# Patient Record
Sex: Female | Born: 1945 | Race: Black or African American | Hispanic: No | Marital: Married | Smoking: Former smoker
Health system: Southern US, Community
[De-identification: ages and names within clinical notes are randomized; demographics above are authoritative.]

## PROBLEM LIST (undated history)

## (undated) DIAGNOSIS — K649 Unspecified hemorrhoids: Secondary | ICD-10-CM

## (undated) DIAGNOSIS — T7840XA Allergy, unspecified, initial encounter: Secondary | ICD-10-CM

## (undated) DIAGNOSIS — Z860100 Personal history of colon polyps, unspecified: Secondary | ICD-10-CM

## (undated) DIAGNOSIS — M6530 Trigger finger, unspecified finger: Secondary | ICD-10-CM

## (undated) DIAGNOSIS — E785 Hyperlipidemia, unspecified: Secondary | ICD-10-CM

## (undated) DIAGNOSIS — H269 Unspecified cataract: Secondary | ICD-10-CM

## (undated) DIAGNOSIS — H409 Unspecified glaucoma: Secondary | ICD-10-CM

## (undated) DIAGNOSIS — Z8619 Personal history of other infectious and parasitic diseases: Secondary | ICD-10-CM

## (undated) DIAGNOSIS — I10 Essential (primary) hypertension: Secondary | ICD-10-CM

## (undated) HISTORY — DX: Unspecified cataract: H26.9

## (undated) HISTORY — PX: TONSILLECTOMY: SUR1361

## (undated) HISTORY — DX: Hyperlipidemia, unspecified: E78.5

## (undated) HISTORY — DX: Unspecified glaucoma: H40.9

## (undated) HISTORY — PX: CYST REMOVAL TRUNK: SHX6283

## (undated) HISTORY — DX: Essential (primary) hypertension: I10

## (undated) HISTORY — PX: COLONOSCOPY: SHX174

## (undated) HISTORY — PX: APPENDECTOMY: SHX54

---

## 2003-11-08 ENCOUNTER — Ambulatory Visit (HOSPITAL_COMMUNITY): Admission: RE | Admit: 2003-11-08 | Discharge: 2003-11-08 | Payer: Self-pay | Admitting: Family Medicine

## 2004-03-15 ENCOUNTER — Other Ambulatory Visit: Admission: RE | Admit: 2004-03-15 | Discharge: 2004-03-15 | Payer: Self-pay | Admitting: Oncology

## 2008-06-28 ENCOUNTER — Ambulatory Visit: Payer: Self-pay | Admitting: Unknown Physician Specialty

## 2008-12-19 ENCOUNTER — Encounter: Admission: RE | Admit: 2008-12-19 | Discharge: 2008-12-19 | Payer: Self-pay | Admitting: Family Medicine

## 2010-03-14 ENCOUNTER — Encounter: Admission: RE | Admit: 2010-03-14 | Discharge: 2010-03-14 | Payer: Self-pay | Admitting: Family Medicine

## 2010-03-20 ENCOUNTER — Ambulatory Visit: Payer: Self-pay | Admitting: Unknown Physician Specialty

## 2010-03-22 LAB — PATHOLOGY REPORT

## 2015-05-25 ENCOUNTER — Other Ambulatory Visit: Payer: Self-pay | Admitting: Family Medicine

## 2015-05-25 DIAGNOSIS — Z1231 Encounter for screening mammogram for malignant neoplasm of breast: Secondary | ICD-10-CM

## 2015-06-11 ENCOUNTER — Inpatient Hospital Stay: Payer: PPO

## 2015-06-11 ENCOUNTER — Encounter: Payer: Self-pay | Admitting: Internal Medicine

## 2015-06-11 ENCOUNTER — Inpatient Hospital Stay: Payer: PPO | Attending: Internal Medicine | Admitting: Internal Medicine

## 2015-06-11 VITALS — BP 145/82 | HR 108 | Temp 97.6°F | Resp 18 | Ht 64.7 in | Wt 133.2 lb

## 2015-06-11 DIAGNOSIS — I10 Essential (primary) hypertension: Secondary | ICD-10-CM | POA: Insufficient documentation

## 2015-06-11 DIAGNOSIS — N390 Urinary tract infection, site not specified: Secondary | ICD-10-CM | POA: Insufficient documentation

## 2015-06-11 DIAGNOSIS — D126 Benign neoplasm of colon, unspecified: Secondary | ICD-10-CM | POA: Insufficient documentation

## 2015-06-11 DIAGNOSIS — D709 Neutropenia, unspecified: Secondary | ICD-10-CM | POA: Diagnosis present

## 2015-06-11 DIAGNOSIS — E785 Hyperlipidemia, unspecified: Secondary | ICD-10-CM | POA: Insufficient documentation

## 2015-06-11 DIAGNOSIS — Z87440 Personal history of urinary (tract) infections: Secondary | ICD-10-CM | POA: Diagnosis not present

## 2015-06-11 DIAGNOSIS — T7840XA Allergy, unspecified, initial encounter: Secondary | ICD-10-CM | POA: Insufficient documentation

## 2015-06-11 DIAGNOSIS — M6530 Trigger finger, unspecified finger: Secondary | ICD-10-CM | POA: Insufficient documentation

## 2015-06-11 DIAGNOSIS — K649 Unspecified hemorrhoids: Secondary | ICD-10-CM | POA: Insufficient documentation

## 2015-06-11 LAB — CBC WITH DIFFERENTIAL/PLATELET
BASOS ABS: 0.2 10*3/uL — AB (ref 0–0.1)
BASOS PCT: 7 %
EOS ABS: 0 10*3/uL (ref 0–0.7)
Eosinophils Relative: 1 %
HCT: 44.2 % (ref 35.0–47.0)
HEMOGLOBIN: 14.8 g/dL (ref 12.0–16.0)
LYMPHS ABS: 1 10*3/uL (ref 1.0–3.6)
Lymphocytes Relative: 33 %
MCH: 32.6 pg (ref 26.0–34.0)
MCHC: 33.5 g/dL (ref 32.0–36.0)
MCV: 97.3 fL (ref 80.0–100.0)
Monocytes Absolute: 0.2 10*3/uL (ref 0.2–0.9)
Monocytes Relative: 6 %
NEUTROS PCT: 53 %
Neutro Abs: 1.7 10*3/uL (ref 1.4–6.5)
Platelets: 224 10*3/uL (ref 150–440)
RBC: 4.54 MIL/uL (ref 3.80–5.20)
RDW: 13.6 % (ref 11.5–14.5)
WBC: 3.2 10*3/uL — AB (ref 3.6–11.0)

## 2015-06-11 LAB — URINALYSIS COMPLETE WITH MICROSCOPIC (ARMC ONLY)
Bilirubin Urine: NEGATIVE
GLUCOSE, UA: NEGATIVE mg/dL
Ketones, ur: NEGATIVE mg/dL
Nitrite: POSITIVE — AB
Protein, ur: NEGATIVE mg/dL
Specific Gravity, Urine: 1.014 (ref 1.005–1.030)
pH: 5 (ref 5.0–8.0)

## 2015-06-11 LAB — VITAMIN B12: VITAMIN B 12: 779 pg/mL (ref 180–914)

## 2015-06-11 LAB — SAVE SMEAR

## 2015-06-11 NOTE — Progress Notes (Signed)
[place] @ [facility] [phone]  [phone]     Marilyn Ward OB: [birth year]  MR#: [id number]  KDX#:[id number]  Patient Care Team: Maryland Pink, MD as PCP - General (Family Medicine)  CHIEF COMPLAINT:  Neutropenia   No flowsheet data found.  HISTORY OF PRESENT ILLNESS:   Marilyn Ward is referred to our clinic for evaluation of neutropenia. Patient does not have clear recollection as of the duration of those problem. However, it appears that she was evaluated approximately 10 years ago by Dr. Humphrey Rolls for similar reason. We have not been able to review all the medical records except for the final clinic note, and it appears that the work up was essentially negative, and the patient was discharged from hematology clinic. Since then Marilyn Ward has done very well clinically she specifically denies fevers, chills, infections, including sinus and ear infection, pneumonias, skin infection, UTI. She was found to have signs of UTI on a recent urinalysis, which was performed as part of a routine physical, and urine culture grew pansensitive Escherichia coli, however she refused ciprofloxacin. She is completely a symptomatic, denying dysuria, urgency, frequency, blood in the urine. She denies joint pain, edema or redness.  REVIEW OF SYSTEMS:   Review of Systems  All other systems reviewed and are negative.    PAST MEDICAL HISTORY: Past Medical History  Diagnosis Date  . Hypertension   . Hyperlipidemia   . Cataract   . Glaucoma     PAST SURGICAL HISTORY: Past Surgical History  Procedure Laterality Date  . Appendectomy    . Tonsillectomy    . Cyst removal trunk      left shoulder    FAMILY HISTORY History reviewed. No pertinent family history. No history of hematologic problems, low blood counts, leukemia. ADVANCED DIRECTIVES:  No flowsheet data found.  HEALTH MAINTENANCE: Social History  Substance Use Topics  . Smoking status: Never Smoker   .  Smokeless tobacco: None  . Alcohol Use: No     Allergies  Allergen Reactions  . Tetracycline Anxiety    Current Outpatient Prescriptions  Medication Sig Dispense Refill  . l-methylfolate-B6-B12 (METANX) 3-35-2 MG TABS tablet Take 1 tablet by mouth daily as needed.    . vitamin B-12 (CYANOCOBALAMIN) 1000 MCG tablet Take 1,000 mcg by mouth daily as needed.    . ciprofloxacin (CIPRO) 250 MG tablet TK 1 T PO BID FOR 7 DAYS  0   No current facility-administered medications for this visit.    OBJECTIVE:  Filed Vitals:   06/11/15 1142  BP: 145/82  Pulse: 108  Temp: 97.6 F (36.4 C)  Resp: 18     There is no height or weight on file to calculate BMI.    ECOG FS:0 - Asymptomatic  Physical Exam  Constitutional: She is oriented to person, place, and time and well-developed, well-nourished, and in no distress. No distress.  African-American female, younger than stated age.  HENT:  Head: Normocephalic and atraumatic.  Right Ear: External ear normal.  Left Ear: External ear normal.  Mouth/Throat: Oropharynx is clear and moist.  Eyes: Conjunctivae are normal. Pupils are equal, round, and reactive to light. Right eye exhibits no discharge. Left eye exhibits no discharge. No scleral icterus.  Neck: Normal range of motion. Neck supple. No JVD present. No tracheal deviation present. No thyromegaly present.  Cardiovascular: Normal rate, regular rhythm, normal heart sounds and intact distal pulses.  Exam reveals no gallop and no friction rub.   No murmur heard. Pulmonary/Chest:  Effort normal and breath sounds normal. No stridor. No respiratory distress. She has no wheezes. She has no rales. She exhibits no tenderness.  Abdominal: Soft. Bowel sounds are normal. She exhibits no distension and no mass. There is no tenderness. There is no rebound and no guarding.  Genitourinary:  Postponed  Musculoskeletal: Normal range of motion. She exhibits no edema or tenderness.  Lymphadenopathy:    She  has no cervical adenopathy.  Neurological: She is alert and oriented to person, place, and time. She has normal reflexes. No cranial nerve deficit. She exhibits normal muscle tone. Gait normal. Coordination normal. GCS score is 15.  Skin: Skin is warm. No rash noted. She is not diaphoretic. No erythema. No pallor.  Psychiatric: Mood, memory, affect and judgment normal.     LAB RESULTS:  Recent Results (from the past 2160 hour(s))  CBC with Differential/Platelet     Status: Abnormal   Collection Time: 06/11/15 12:38 PM  Result Value Ref Range   WBC 3.2 (L) 3.6 - 11.0 K/uL   RBC 4.54 3.80 - 5.20 MIL/uL   Hemoglobin 14.8 12.0 - 16.0 g/dL   HCT 44.2 35.0 - 47.0 %   MCV 97.3 80.0 - 100.0 fL   MCH 32.6 26.0 - 34.0 pg   MCHC 33.5 32.0 - 36.0 g/dL   RDW 13.6 11.5 - 14.5 %   Platelets 224 150 - 440 K/uL   Neutrophils Relative % 53 %   Neutro Abs 1.7 1.4 - 6.5 K/uL   Lymphocytes Relative 33 %   Lymphs Abs 1.0 1.0 - 3.6 K/uL   Monocytes Relative 6 %   Monocytes Absolute 0.2 0.2 - 0.9 K/uL   Eosinophils Relative 1 %   Eosinophils Absolute 0.0 0 - 0.7 K/uL   Basophils Relative 7 %   Basophils Absolute 0.2 (H) 0 - 0.1 K/uL         STUDIES: No results found.  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 Also, she has not had any evidence of persistent or recurrent symptomatic infections, although urinalysis suggests subclinical UTI at least on 2 occasions one year apart. Overall, the differential diagnosis of neutropenia is brought, including vitamin B12 and copper deficiency, hepatitis and HIV infection, common variable immune deficiency and, myelodysplastic syndrome. Since  we do not have any results of the workup from the previous evaluation 10 years ago, and significant time has passed since then, we will perform frequent workup, chicken vitamin B12 levels, zinc and copper levels, hepatitis and HIV testing, peripheral blood smear review. If no worrisome findings emerge, and hematologic parameters are stable, we will likely monitor her clinically without any additional invasive procedures. However, we will consider bone marrow biopsy if absolute neutrophil count continues to decline.  UTI- patient is asymptomatic, but taking into account the persistent nature of abnormalities seen on urinalysis, we will repeat urinalysis and urine culture today and will strongly advise to take antibiotics to prevent potential damage to the urinary tract and kidneys.  Patient is due for mammogram later this year; she is up-to-date with colonoscopy, requiring it every 5 years.  Return to the clinic in 2 weeks to discuss the results of the workup.  Patient expressed understanding  and was in agreement with this plan. She also understands that She can call clinic at any time with any questions, concerns, or complaints.    No matching staging information was found for the patient.  Roxana Hires, MD   06/11/2015 11:19 AM

## 2015-06-11 NOTE — Progress Notes (Signed)
Pt new eval for leukopenia, pt has no fevers, no night sweats, no enlarged lymph nodes.  Was sent here for lab abnormalities and no sx.

## 2015-06-12 LAB — HIV ANTIBODY (ROUTINE TESTING W REFLEX): HIV SCREEN 4TH GENERATION: NONREACTIVE

## 2015-06-13 LAB — ANA W/REFLEX: Anti Nuclear Antibody(ANA): NEGATIVE

## 2015-06-13 LAB — HEPATITIS C ANTIBODY

## 2015-06-13 LAB — ZINC: ZINC: 92 ug/dL (ref 56–134)

## 2015-06-13 LAB — HEPATITIS B SURFACE ANTIBODY, QUANTITATIVE

## 2015-06-13 LAB — URINE CULTURE: Culture: >=100000

## 2015-06-13 LAB — HEPATITIS B CORE ANTIBODY, TOTAL: Hep B Core Total Ab: NEGATIVE

## 2015-06-13 LAB — COPPER, SERUM: Copper: 97 ug/dL (ref 72–166)

## 2015-06-18 ENCOUNTER — Telehealth: Payer: Self-pay | Admitting: *Deleted

## 2015-06-18 ENCOUNTER — Ambulatory Visit
Admission: RE | Admit: 2015-06-18 | Discharge: 2015-06-18 | Disposition: A | Discharge status: Home / Self Care | Payer: PPO | Source: Ambulatory Visit | Attending: Family Medicine | Admitting: Family Medicine

## 2015-06-18 DIAGNOSIS — Z1231 Encounter for screening mammogram for malignant neoplasm of breast: Secondary | ICD-10-CM | POA: Diagnosis present

## 2015-06-18 NOTE — Telephone Encounter (Signed)
Called pt to let her know that ua and urine cult. Results and it did confirm E coli like at the PCP office.  Dr. Rudean Hitt states that pt should take the atb she was prescribed with PCP.Marland Kitchen She has cipro 250 mg bid.  Pt states she will start atb.

## 2015-06-25 ENCOUNTER — Encounter: Payer: Self-pay | Admitting: Internal Medicine

## 2015-06-25 ENCOUNTER — Inpatient Hospital Stay: Payer: PPO | Attending: Internal Medicine | Admitting: Internal Medicine

## 2015-06-25 VITALS — BP 160/95 | HR 85 | Temp 97.6°F | Resp 18 | Ht 64.7 in | Wt 132.9 lb

## 2015-06-25 DIAGNOSIS — H409 Unspecified glaucoma: Secondary | ICD-10-CM | POA: Insufficient documentation

## 2015-06-25 DIAGNOSIS — E785 Hyperlipidemia, unspecified: Secondary | ICD-10-CM | POA: Diagnosis not present

## 2015-06-25 DIAGNOSIS — I10 Essential (primary) hypertension: Secondary | ICD-10-CM | POA: Insufficient documentation

## 2015-06-25 DIAGNOSIS — D709 Neutropenia, unspecified: Secondary | ICD-10-CM | POA: Diagnosis not present

## 2015-06-25 DIAGNOSIS — Z1231 Encounter for screening mammogram for malignant neoplasm of breast: Secondary | ICD-10-CM | POA: Insufficient documentation

## 2015-06-25 DIAGNOSIS — N390 Urinary tract infection, site not specified: Secondary | ICD-10-CM | POA: Insufficient documentation

## 2015-06-25 DIAGNOSIS — Z79899 Other long term (current) drug therapy: Secondary | ICD-10-CM | POA: Diagnosis not present

## 2015-06-25 DIAGNOSIS — H269 Unspecified cataract: Secondary | ICD-10-CM | POA: Insufficient documentation

## 2015-06-25 NOTE — Progress Notes (Signed)
Pt here to get results of labs done for her neutropenia. No fevers noted.  Will finish up last pill of the cipro today for uti.

## 2015-06-25 NOTE — Progress Notes (Addendum)
[place] @ [facility] [phone]  [phone]     Marilyn Ward OB: 1945/09/17  MR#: [id number]  [id number]  Patient Care Team: Maryland Pink, MD as PCP - General (Family Medicine)  CHIEF COMPLAINT:  Neutropenia   No flowsheet data found.  HISTORY OF PRESENT ILLNESS:   Marilyn Ward is referred to our clinic for evaluation of neutropenia. Patient does not have clear recollection as of the duration of those problem. However, it appears that she was evaluated approximately 10 years ago by Dr. Humphrey Rolls for similar reason. We have not been able to review all the medical records except for the final clinic note, and it appears that the work up was essentially negative, and the patient was discharged from hematology clinic.   Current status: Marilyn Ward returns to the clinic to discuss the results of the workup. She continues to do well clinically. She has almost completed treatment course for UTI with only 1 dose of ciprofloxacin (take.  Since then Marilyn Ward has done very well clinically she specifically denies fevers, chills, infections, including sinus and ear infection, pneumonias, skin infection. She is completely asymptomatic, denying dysuria, urgency, frequency, blood in the urine. She denies joint pain, edema or redness.  REVIEW OF SYSTEMS:   Review of Systems  All other systems reviewed and are negative.    PAST MEDICAL HISTORY: Past Medical History  Diagnosis Date  . Hypertension   . Hyperlipidemia   . Cataract   . Glaucoma     PAST SURGICAL HISTORY: Past Surgical History  Procedure Laterality Date  . Appendectomy    . Tonsillectomy    . Cyst removal trunk      left shoulder    FAMILY HISTORY No family history on file. No history of hematologic problems, low blood counts, leukemia. ADVANCED DIRECTIVES:  No flowsheet data found.  HEALTH MAINTENANCE: Social History  Substance Use Topics  . Smoking status: Never Smoker   .  Smokeless tobacco: Not on file  . Alcohol Use: No     Allergies  Allergen Reactions  . Tetracycline Anxiety    Current Outpatient Prescriptions  Medication Sig Dispense Refill  . ciprofloxacin (CIPRO) 250 MG tablet TK 1 T PO BID FOR 7 DAYS  0  . l-methylfolate-B6-B12 (METANX) 3-35-2 MG TABS tablet Take 1 tablet by mouth daily as needed.    . vitamin B-12 (CYANOCOBALAMIN) 1000 MCG tablet Take 1,000 mcg by mouth daily as needed.     No current facility-administered medications for this visit.    OBJECTIVE:  Filed Vitals:   06/25/15 1022  BP: 160/95  Pulse: 85  Temp: 97.6 F (36.4 C)  Resp: 18     Body mass index is 22.34 kg/(m^2).    ECOG FS:0 - Asymptomatic  Physical Exam  Constitutional: She is oriented to person, place, and time and well-developed, well-nourished, and in no distress. No distress.  African-American female, younger than stated age.  HENT:  Head: Normocephalic and atraumatic.  Right Ear: External ear normal.  Left Ear: External ear normal.  Mouth/Throat: Oropharynx is clear and moist.  Eyes: Conjunctivae are normal. Pupils are equal, round, and reactive to light. Right eye exhibits no discharge. Left eye exhibits no discharge. No scleral icterus.  Neck: Normal range of motion. Neck supple. No JVD present. No tracheal deviation present. No thyromegaly present.  Cardiovascular: Normal rate, regular rhythm, normal heart sounds and intact distal pulses.  Exam reveals no gallop and no friction rub.   No murmur heard.  Pulmonary/Chest: Effort normal and breath sounds normal. No stridor. No respiratory distress. She has no wheezes. She has no rales. She exhibits no tenderness.  Abdominal: Soft. Bowel sounds are normal. She exhibits no distension and no mass. There is no tenderness. There is no rebound and no guarding.  Genitourinary:  Postponed  Musculoskeletal: Normal range of motion. She exhibits no edema or tenderness.  Lymphadenopathy:    She has no cervical  adenopathy.  Neurological: She is alert and oriented to person, place, and time. She has normal reflexes. No cranial nerve deficit. She exhibits normal muscle tone. Gait normal. Coordination normal. GCS score is 15.  Skin: Skin is warm. No rash noted. She is not diaphoretic. No erythema. No pallor.  Psychiatric: Mood, memory, affect and judgment normal.     LAB RESULTS:  Recent Results (from the past 2160 hour(s))  HIV antibody (with reflex)     Status: None   Collection Time: 06/11/15 12:38 PM  Result Value Ref Range   HIV Screen 4th Generation wRfx Non Reactive Non Reactive    Comment: (NOTE) Performed At: [facility] [place], [place] [id number] Lindon Romp MD [id number]   Save smear     Status: None   Collection Time: 06/11/15 12:38 PM  Result Value Ref Range   Smear Review SMEAR STAINED AND AVAILABLE FOR REVIEW   CBC with Differential/Platelet     Status: Abnormal   Collection Time: 06/11/15 12:38 PM  Result Value Ref Range   WBC 3.2 (L) 3.6 - 11.0 K/uL   RBC 4.54 3.80 - 5.20 MIL/uL   Hemoglobin 14.8 12.0 - 16.0 g/dL   HCT 44.2 35.0 - 47.0 %   MCV 97.3 80.0 - 100.0 fL   MCH 32.6 26.0 - 34.0 pg   MCHC 33.5 32.0 - 36.0 g/dL   RDW 13.6 11.5 - 14.5 %   Platelets 224 150 - 440 K/uL   Neutrophils Relative % 53 %   Neutro Abs 1.7 1.4 - 6.5 K/uL   Lymphocytes Relative 33 %   Lymphs Abs 1.0 1.0 - 3.6 K/uL   Monocytes Relative 6 %   Monocytes Absolute 0.2 0.2 - 0.9 K/uL   Eosinophils Relative 1 %   Eosinophils Absolute 0.0 0 - 0.7 K/uL   Basophils Relative 7 %   Basophils Absolute 0.2 (H) 0 - 0.1 K/uL  Vitamin B12     Status: None   Collection Time: 06/11/15 12:38 PM  Result Value Ref Range   Vitamin B-12 779 180 - 914 pg/mL    Comment: (NOTE) This assay is not validated for testing neonatal or myeloproliferative syndrome specimens for Vitamin B12 levels. Performed at [facility]   ANA w/Reflex     Status: None   Collection  Time: 06/11/15 12:38 PM  Result Value Ref Range   Anit Nuclear Antibody(ANA) Negative Negative    Comment: (NOTE) Performed At: [facility] [address] [place], [place] [id number] Lindon Romp MD [id number]   Copper, serum     Status: None   Collection Time: 06/11/15 12:38 PM  Result Value Ref Range   Copper 97 72 - 166 ug/dL    Comment: (NOTE)                                Detection Limit = 5 Performed At: [facility] [place], [place] [id number] Lindon Romp MD [id number]  Zinc     Status: None   Collection Time: 06/11/15 12:38 PM  Result Value Ref Range   Zinc 92 56 - 134 ug/dL    Comment: (NOTE)                                Detection Limit = 5 Performed At: [facility] [address] [place], [place] [id number] Lindon Romp MD [id number]   Hepatitis C antibody     Status: None   Collection Time: 06/11/15 12:38 PM  Result Value Ref Range   HCV Ab <0.1 0.0 - 0.9 s/co ratio    Comment: (NOTE)                                  Negative:     < 0.8                             Indeterminate: 0.8 - 0.9                                  Positive:     > 0.9 The CDC recommends that a positive HCV antibody result be followed up with a HCV Nucleic Acid Amplification test [id number]). Performed At: [facility] [place], [place] [id number] Lindon Romp MD [id number]   Hepatitis B core antibody, total     Status: None   Collection Time: 06/11/15 12:38 PM  Result Value Ref Range   Hep B Core Total Ab Negative Negative    Comment: (NOTE) Performed At: [facility] [place], [place] [id number] Lindon Romp MD [id number]   Hepatitis B surface antibody     Status: Abnormal   Collection Time: 06/11/15 12:38 PM  Result Value Ref Range   Hepatitis B-Post <3.1 (L) Immunity>9.9 mIU/mL    Comment: (NOTE)  Status of Immunity                     Anti-HBs Level   ------------------                     -------------- Inconsistent with Immunity                   0.0 - 9.9 Consistent with Immunity                          >9.9 Performed At: [facility] [place], [place] [id number] Lindon Romp MD [id number]   Urinalysis complete, with microscopic [facility])     Status: Abnormal   Collection Time: 06/11/15  1:20 PM  Result Value Ref Range   Color, Urine YELLOW (A) YELLOW   APPearance HAZY (A) CLEAR   Glucose, UA NEGATIVE NEGATIVE mg/dL   Bilirubin Urine NEGATIVE NEGATIVE   Ketones, ur NEGATIVE NEGATIVE mg/dL   Specific Gravity, Urine 1.014 1.005 - 1.030   Hgb urine dipstick 1+ (A) NEGATIVE   pH 5.0 5.0 - 8.0   Protein, ur NEGATIVE NEGATIVE mg/dL   Nitrite POSITIVE (A) NEGATIVE   Leukocytes, UA TRACE (A) NEGATIVE   RBC / HPF 0-5 0 - 5 RBC/hpf   WBC, UA 0-5 0 -  5 WBC/hpf   Bacteria, UA MANY (A) NONE SEEN   Squamous Epithelial / LPF 0-5 (A) NONE SEEN   Mucous PRESENT   Urine culture     Status: None   Collection Time: 06/11/15  1:20 PM  Result Value Ref Range   Specimen Description URINE, CLEAN CATCH    Special Requests urine    Culture >=100,000 COLONIES/mL ESCHERICHIA COLI    Report Status 06/13/2015 FINAL    Organism ID, Bacteria ESCHERICHIA COLI       Susceptibility   Escherichia coli - MIC*    AMPICILLIN Value in next row Resistant      RESISTANT>=32    CEFAZOLIN Value in next row Sensitive      SENSITIVE<=4    CEFTRIAXONE Value in next row Sensitive      SENSITIVE<=1    ERTAPENEM Value in next row Sensitive      SENSITIVE<=0.5    Extended ESBL Value in next row Sensitive      SENSITIVE<=0.5    IMIPENEM Value in next row Sensitive      SENSITIVE<=0.25    GENTAMICIN Value in next row Sensitive      SENSITIVE<=1    CIPROFLOXACIN Value in next row Sensitive      SENSITIVE<=0.25    LEVOFLOXACIN Value in next row Sensitive      SENSITIVE<=0.12    NITROFURANTOIN Value in next row Sensitive       SENSITIVE<=16    TRIMETH/SULFA Value in next row Sensitive      SENSITIVE<=20    PIP/TAZO Value in next row Sensitive      SENSITIVE<=4    * >=100,000 COLONIES/mL ESCHERICHIA COLI         STUDIES: Mm Digital Screening Bilateral  06/18/2015  CLINICAL DATA:  Screening. EXAM: DIGITAL SCREENING BILATERAL MAMMOGRAM WITH CAD COMPARISON:  Previous exam(s). ACR Breast Density Category c: The breast tissue is heterogeneously dense, which may obscure small masses. FINDINGS: There are no findings suspicious for malignancy. Images were processed with CAD. IMPRESSION: No mammographic evidence of malignancy. A result letter of this screening mammogram will be mailed directly to the patient. RECOMMENDATION: Screening mammogram in one year. (Code:SM-B-01Y) BI-RADS CATEGORY  1: Negative. Electronically Signed   By: Franki Cabot M.D.   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 Also, she has not had any evidence of persistent or recurrent symptomatic infections, although urinalysis suggests subclinical UTI at least on 2 occasions one year apart.  Our workup also did not show any evidence of vitamin or mineral deficiency, hepatitis or HIV infection. Indeed, her absolute neutrophil count is entirely within normal range at 1700 cells per microliter. 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 She does not require any additional follow-up with hematology or oncology.   UTI- hour urinalysis and urine cultures were consistent with urinary tract infection with Escherichia  coli, so she was initiated on treatment with ciprofloxacin, to which Escherichia coli is sensitive. She will complete the treatment today and we will recheck her urinalysis and urine culture next week at the primary care physician's office.  Patient had mammogram on 06/18/2015, and it was negative; she is up-to-date with colonoscopy, requiring it every 5 years.  Patient expressed understanding and was in agreement with this plan. She also understands that She can call clinic at any time with any questions, concerns, or complaints.    No matching staging information was found for the patient.  Roxana Hires, MD   06/25/2015 10:17 AM

## 2015-09-05 DIAGNOSIS — H2513 Age-related nuclear cataract, bilateral: Secondary | ICD-10-CM | POA: Diagnosis not present

## 2015-09-05 DIAGNOSIS — H401111 Primary open-angle glaucoma, right eye, mild stage: Secondary | ICD-10-CM | POA: Diagnosis not present

## 2015-09-05 DIAGNOSIS — H401123 Primary open-angle glaucoma, left eye, severe stage: Secondary | ICD-10-CM | POA: Diagnosis not present

## 2015-10-19 DIAGNOSIS — H401111 Primary open-angle glaucoma, right eye, mild stage: Secondary | ICD-10-CM | POA: Diagnosis not present

## 2015-11-02 DIAGNOSIS — H04123 Dry eye syndrome of bilateral lacrimal glands: Secondary | ICD-10-CM | POA: Diagnosis not present

## 2015-11-02 DIAGNOSIS — H401111 Primary open-angle glaucoma, right eye, mild stage: Secondary | ICD-10-CM | POA: Diagnosis not present

## 2015-11-02 DIAGNOSIS — H2513 Age-related nuclear cataract, bilateral: Secondary | ICD-10-CM | POA: Diagnosis not present

## 2015-11-02 DIAGNOSIS — H401131 Primary open-angle glaucoma, bilateral, mild stage: Secondary | ICD-10-CM | POA: Diagnosis not present

## 2015-12-01 DIAGNOSIS — R404 Transient alteration of awareness: Secondary | ICD-10-CM | POA: Diagnosis not present

## 2016-02-22 DIAGNOSIS — H6123 Impacted cerumen, bilateral: Secondary | ICD-10-CM | POA: Diagnosis not present

## 2016-05-14 DIAGNOSIS — Z0000 Encounter for general adult medical examination without abnormal findings: Secondary | ICD-10-CM | POA: Diagnosis not present

## 2016-05-15 DIAGNOSIS — N390 Urinary tract infection, site not specified: Secondary | ICD-10-CM | POA: Diagnosis not present

## 2016-05-21 DIAGNOSIS — Z0000 Encounter for general adult medical examination without abnormal findings: Secondary | ICD-10-CM | POA: Diagnosis not present

## 2016-05-21 DIAGNOSIS — I10 Essential (primary) hypertension: Secondary | ICD-10-CM | POA: Diagnosis not present

## 2016-05-21 DIAGNOSIS — E785 Hyperlipidemia, unspecified: Secondary | ICD-10-CM | POA: Diagnosis not present

## 2016-05-21 DIAGNOSIS — H6123 Impacted cerumen, bilateral: Secondary | ICD-10-CM | POA: Diagnosis not present

## 2016-06-05 DIAGNOSIS — H903 Sensorineural hearing loss, bilateral: Secondary | ICD-10-CM | POA: Diagnosis not present

## 2016-06-05 DIAGNOSIS — H6123 Impacted cerumen, bilateral: Secondary | ICD-10-CM | POA: Diagnosis not present

## 2016-06-19 DIAGNOSIS — H401111 Primary open-angle glaucoma, right eye, mild stage: Secondary | ICD-10-CM | POA: Diagnosis not present

## 2016-06-19 DIAGNOSIS — H401122 Primary open-angle glaucoma, left eye, moderate stage: Secondary | ICD-10-CM | POA: Diagnosis not present

## 2016-07-23 DIAGNOSIS — G5702 Lesion of sciatic nerve, left lower limb: Secondary | ICD-10-CM | POA: Diagnosis not present

## 2016-07-30 DIAGNOSIS — Z86010 Personal history of colonic polyps: Secondary | ICD-10-CM | POA: Diagnosis not present

## 2016-09-03 DIAGNOSIS — H401122 Primary open-angle glaucoma, left eye, moderate stage: Secondary | ICD-10-CM | POA: Diagnosis not present

## 2016-09-03 DIAGNOSIS — H2513 Age-related nuclear cataract, bilateral: Secondary | ICD-10-CM | POA: Diagnosis not present

## 2016-09-30 ENCOUNTER — Encounter: Payer: Self-pay | Admitting: *Deleted

## 2016-10-01 ENCOUNTER — Encounter: Admission: RE | Payer: Self-pay | Source: Ambulatory Visit

## 2016-10-01 ENCOUNTER — Ambulatory Visit: Admission: RE | Admit: 2016-10-01 | Payer: PPO | Source: Ambulatory Visit | Admitting: Unknown Physician Specialty

## 2016-10-01 HISTORY — DX: Personal history of colonic polyps: Z86.010

## 2016-10-01 HISTORY — DX: Personal history of colon polyps, unspecified: Z86.0100

## 2016-10-01 HISTORY — DX: Trigger finger, unspecified finger: M65.30

## 2016-10-01 HISTORY — DX: Allergy, unspecified, initial encounter: T78.40XA

## 2016-10-01 HISTORY — DX: Unspecified hemorrhoids: K64.9

## 2016-10-01 HISTORY — DX: Personal history of other infectious and parasitic diseases: Z86.19

## 2016-10-01 SURGERY — COLONOSCOPY WITH PROPOFOL
Anesthesia: General

## 2016-11-10 DIAGNOSIS — B029 Zoster without complications: Secondary | ICD-10-CM | POA: Diagnosis not present

## 2016-12-12 DIAGNOSIS — M792 Neuralgia and neuritis, unspecified: Secondary | ICD-10-CM | POA: Diagnosis not present

## 2016-12-12 DIAGNOSIS — B029 Zoster without complications: Secondary | ICD-10-CM | POA: Diagnosis not present

## 2016-12-19 ENCOUNTER — Encounter: Admission: RE | Payer: Self-pay | Source: Ambulatory Visit

## 2016-12-19 ENCOUNTER — Ambulatory Visit: Admission: RE | Admit: 2016-12-19 | Payer: PPO | Source: Ambulatory Visit | Admitting: Unknown Physician Specialty

## 2016-12-19 SURGERY — COLONOSCOPY WITH PROPOFOL
Anesthesia: General

## 2017-01-22 ENCOUNTER — Telehealth: Payer: Self-pay | Admitting: *Deleted

## 2017-01-22 NOTE — Telephone Encounter (Signed)
Call from relative stating that patient seen by Dr Rudean Hitt in 2016 for neutropenia and that she was told she could come back again if needed. Reports that she has had right hip pain since June 2017 and she is requesting to be evaluated here to be sure that she does not have "something going on with her bane marrow" I advised that she needs to contact her PCP and if he feels she needs to come back, then he can refer her here. According to her chart, she has shingles from her right abd and back to chest and flank area

## 2017-01-28 DIAGNOSIS — H2513 Age-related nuclear cataract, bilateral: Secondary | ICD-10-CM | POA: Diagnosis not present

## 2017-01-28 DIAGNOSIS — H401123 Primary open-angle glaucoma, left eye, severe stage: Secondary | ICD-10-CM | POA: Diagnosis not present

## 2017-01-28 DIAGNOSIS — H401111 Primary open-angle glaucoma, right eye, mild stage: Secondary | ICD-10-CM | POA: Diagnosis not present

## 2017-02-02 DIAGNOSIS — S99912A Unspecified injury of left ankle, initial encounter: Secondary | ICD-10-CM | POA: Diagnosis not present

## 2017-02-02 DIAGNOSIS — M25552 Pain in left hip: Secondary | ICD-10-CM | POA: Diagnosis not present

## 2017-02-02 DIAGNOSIS — S93402A Sprain of unspecified ligament of left ankle, initial encounter: Secondary | ICD-10-CM | POA: Diagnosis not present

## 2017-02-02 DIAGNOSIS — M79672 Pain in left foot: Secondary | ICD-10-CM | POA: Diagnosis not present

## 2017-02-02 DIAGNOSIS — R300 Dysuria: Secondary | ICD-10-CM | POA: Diagnosis not present

## 2017-03-18 DIAGNOSIS — S93402A Sprain of unspecified ligament of left ankle, initial encounter: Secondary | ICD-10-CM | POA: Diagnosis not present

## 2017-03-18 DIAGNOSIS — M25572 Pain in left ankle and joints of left foot: Secondary | ICD-10-CM | POA: Diagnosis not present

## 2017-03-30 DIAGNOSIS — M25572 Pain in left ankle and joints of left foot: Secondary | ICD-10-CM | POA: Diagnosis not present

## 2017-05-19 DIAGNOSIS — Z0000 Encounter for general adult medical examination without abnormal findings: Secondary | ICD-10-CM | POA: Diagnosis not present

## 2017-05-26 DIAGNOSIS — E785 Hyperlipidemia, unspecified: Secondary | ICD-10-CM | POA: Diagnosis not present

## 2017-05-26 DIAGNOSIS — Z0000 Encounter for general adult medical examination without abnormal findings: Secondary | ICD-10-CM | POA: Diagnosis not present

## 2017-05-28 ENCOUNTER — Other Ambulatory Visit: Payer: Self-pay | Admitting: Family Medicine

## 2017-05-28 DIAGNOSIS — Z1231 Encounter for screening mammogram for malignant neoplasm of breast: Secondary | ICD-10-CM

## 2017-06-03 DIAGNOSIS — H401123 Primary open-angle glaucoma, left eye, severe stage: Secondary | ICD-10-CM | POA: Diagnosis not present

## 2017-06-03 DIAGNOSIS — H401111 Primary open-angle glaucoma, right eye, mild stage: Secondary | ICD-10-CM | POA: Diagnosis not present

## 2017-07-28 DIAGNOSIS — Z86010 Personal history of colonic polyps: Secondary | ICD-10-CM | POA: Diagnosis not present

## 2017-09-30 DIAGNOSIS — H401111 Primary open-angle glaucoma, right eye, mild stage: Secondary | ICD-10-CM | POA: Diagnosis not present

## 2017-09-30 DIAGNOSIS — H2513 Age-related nuclear cataract, bilateral: Secondary | ICD-10-CM | POA: Diagnosis not present

## 2017-09-30 DIAGNOSIS — H401123 Primary open-angle glaucoma, left eye, severe stage: Secondary | ICD-10-CM | POA: Diagnosis not present

## 2017-10-09 ENCOUNTER — Ambulatory Visit: Admission: RE | Admit: 2017-10-09 | Payer: PPO | Source: Ambulatory Visit | Admitting: Unknown Physician Specialty

## 2017-10-09 ENCOUNTER — Encounter: Admission: RE | Payer: Self-pay | Source: Ambulatory Visit

## 2017-10-09 SURGERY — COLONOSCOPY WITH PROPOFOL
Anesthesia: General

## 2018-05-19 DIAGNOSIS — D1724 Benign lipomatous neoplasm of skin and subcutaneous tissue of left leg: Secondary | ICD-10-CM | POA: Diagnosis not present

## 2018-05-19 DIAGNOSIS — M25572 Pain in left ankle and joints of left foot: Secondary | ICD-10-CM | POA: Diagnosis not present

## 2018-06-10 DIAGNOSIS — H401123 Primary open-angle glaucoma, left eye, severe stage: Secondary | ICD-10-CM | POA: Diagnosis not present

## 2018-06-10 DIAGNOSIS — H2513 Age-related nuclear cataract, bilateral: Secondary | ICD-10-CM | POA: Diagnosis not present

## 2018-06-10 DIAGNOSIS — H04123 Dry eye syndrome of bilateral lacrimal glands: Secondary | ICD-10-CM | POA: Diagnosis not present

## 2018-06-10 DIAGNOSIS — H401111 Primary open-angle glaucoma, right eye, mild stage: Secondary | ICD-10-CM | POA: Diagnosis not present

## 2018-09-01 DIAGNOSIS — H401111 Primary open-angle glaucoma, right eye, mild stage: Secondary | ICD-10-CM | POA: Diagnosis not present

## 2018-09-01 DIAGNOSIS — H401123 Primary open-angle glaucoma, left eye, severe stage: Secondary | ICD-10-CM | POA: Diagnosis not present

## 2018-09-01 DIAGNOSIS — H5201 Hypermetropia, right eye: Secondary | ICD-10-CM | POA: Diagnosis not present

## 2019-01-12 DIAGNOSIS — H5201 Hypermetropia, right eye: Secondary | ICD-10-CM | POA: Diagnosis not present

## 2019-01-12 DIAGNOSIS — H401111 Primary open-angle glaucoma, right eye, mild stage: Secondary | ICD-10-CM | POA: Diagnosis not present

## 2019-01-12 DIAGNOSIS — H401123 Primary open-angle glaucoma, left eye, severe stage: Secondary | ICD-10-CM | POA: Diagnosis not present

## 2019-01-12 DIAGNOSIS — H2513 Age-related nuclear cataract, bilateral: Secondary | ICD-10-CM | POA: Diagnosis not present

## 2019-04-18 DIAGNOSIS — M5432 Sciatica, left side: Secondary | ICD-10-CM | POA: Diagnosis not present

## 2019-04-18 DIAGNOSIS — M25572 Pain in left ankle and joints of left foot: Secondary | ICD-10-CM | POA: Diagnosis not present

## 2019-04-26 DIAGNOSIS — M47816 Spondylosis without myelopathy or radiculopathy, lumbar region: Secondary | ICD-10-CM | POA: Diagnosis not present

## 2019-04-26 DIAGNOSIS — M5136 Other intervertebral disc degeneration, lumbar region: Secondary | ICD-10-CM | POA: Diagnosis not present

## 2019-04-26 DIAGNOSIS — M25552 Pain in left hip: Secondary | ICD-10-CM | POA: Diagnosis not present

## 2019-04-26 DIAGNOSIS — M5442 Lumbago with sciatica, left side: Secondary | ICD-10-CM | POA: Diagnosis not present

## 2019-04-26 DIAGNOSIS — M545 Low back pain: Secondary | ICD-10-CM | POA: Diagnosis not present

## 2019-04-26 DIAGNOSIS — M5126 Other intervertebral disc displacement, lumbar region: Secondary | ICD-10-CM | POA: Diagnosis not present

## 2019-04-26 DIAGNOSIS — M5416 Radiculopathy, lumbar region: Secondary | ICD-10-CM | POA: Diagnosis not present

## 2019-06-14 DIAGNOSIS — Z0000 Encounter for general adult medical examination without abnormal findings: Secondary | ICD-10-CM | POA: Diagnosis not present

## 2019-06-14 DIAGNOSIS — E785 Hyperlipidemia, unspecified: Secondary | ICD-10-CM | POA: Diagnosis not present

## 2019-06-21 DIAGNOSIS — Z23 Encounter for immunization: Secondary | ICD-10-CM | POA: Diagnosis not present

## 2019-06-21 DIAGNOSIS — I10 Essential (primary) hypertension: Secondary | ICD-10-CM | POA: Diagnosis not present

## 2019-06-21 DIAGNOSIS — Z0000 Encounter for general adult medical examination without abnormal findings: Secondary | ICD-10-CM | POA: Diagnosis not present

## 2019-06-21 DIAGNOSIS — N390 Urinary tract infection, site not specified: Secondary | ICD-10-CM | POA: Diagnosis not present

## 2019-06-21 DIAGNOSIS — E785 Hyperlipidemia, unspecified: Secondary | ICD-10-CM | POA: Diagnosis not present

## 2019-06-21 DIAGNOSIS — L989 Disorder of the skin and subcutaneous tissue, unspecified: Secondary | ICD-10-CM | POA: Diagnosis not present

## 2019-06-23 ENCOUNTER — Other Ambulatory Visit: Payer: Self-pay | Admitting: Family Medicine

## 2019-06-23 DIAGNOSIS — Z1231 Encounter for screening mammogram for malignant neoplasm of breast: Secondary | ICD-10-CM

## 2019-07-01 ENCOUNTER — Ambulatory Visit
Admission: RE | Admit: 2019-07-01 | Discharge: 2019-07-01 | Disposition: A | Discharge status: Home / Self Care | Payer: PPO | Source: Ambulatory Visit | Attending: Family Medicine | Admitting: Family Medicine

## 2019-07-01 DIAGNOSIS — Z1231 Encounter for screening mammogram for malignant neoplasm of breast: Secondary | ICD-10-CM | POA: Insufficient documentation

## 2021-05-03 ENCOUNTER — Other Ambulatory Visit: Payer: Self-pay | Admitting: Internal Medicine

## 2021-05-03 DIAGNOSIS — Z1231 Encounter for screening mammogram for malignant neoplasm of breast: Secondary | ICD-10-CM

## 2021-08-11 IMAGING — MG DIGITAL SCREENING BILAT W/ TOMO W/ CAD
8 series · 9 of 24 positions shown · non-contrast
Comparison: Previous exam(s).

CLINICAL DATA: Screening.

EXAM:
DIGITAL SCREENING BILATERAL MAMMOGRAM WITH TOMO AND CAD

[L MLO synth-2D]
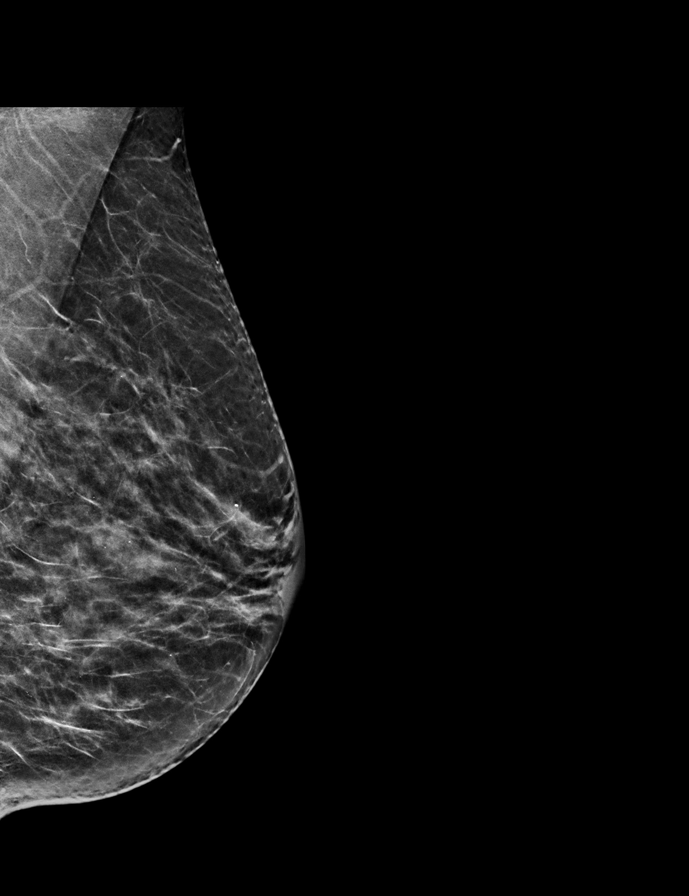

[L CC synth-2D]
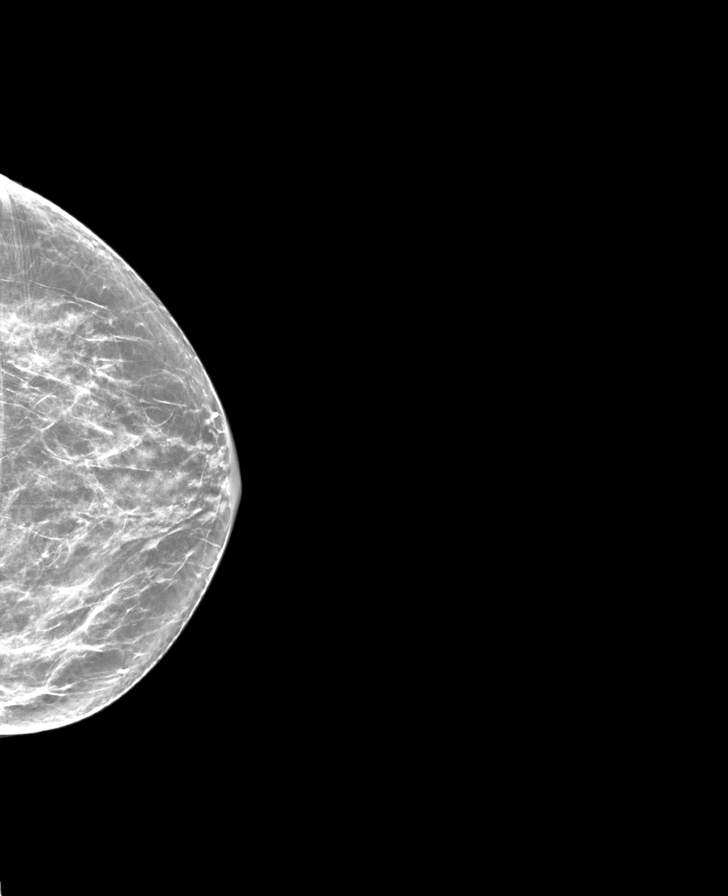

[R MLO synth-2D]
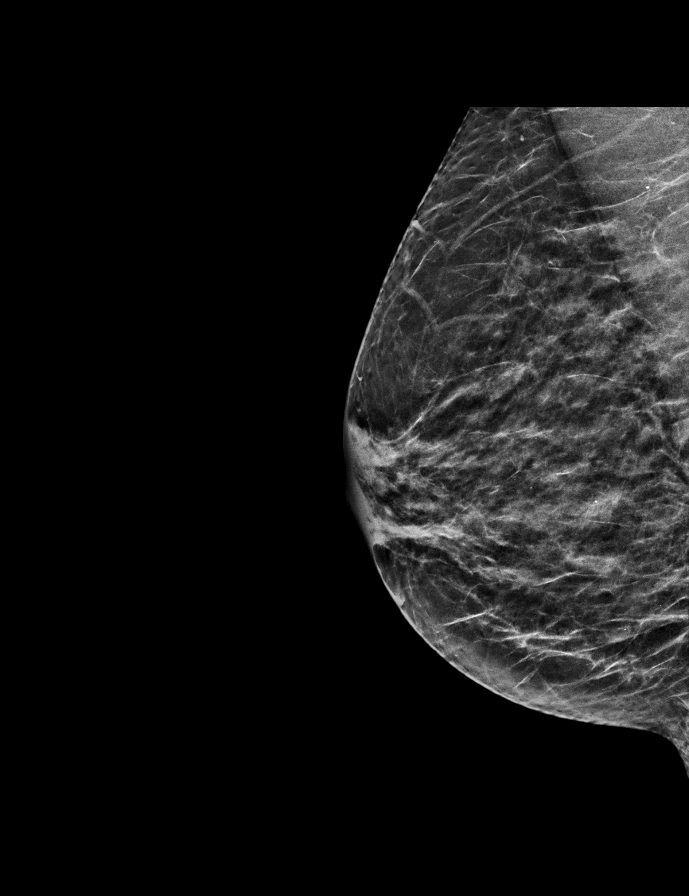

[R CC synth-2D]
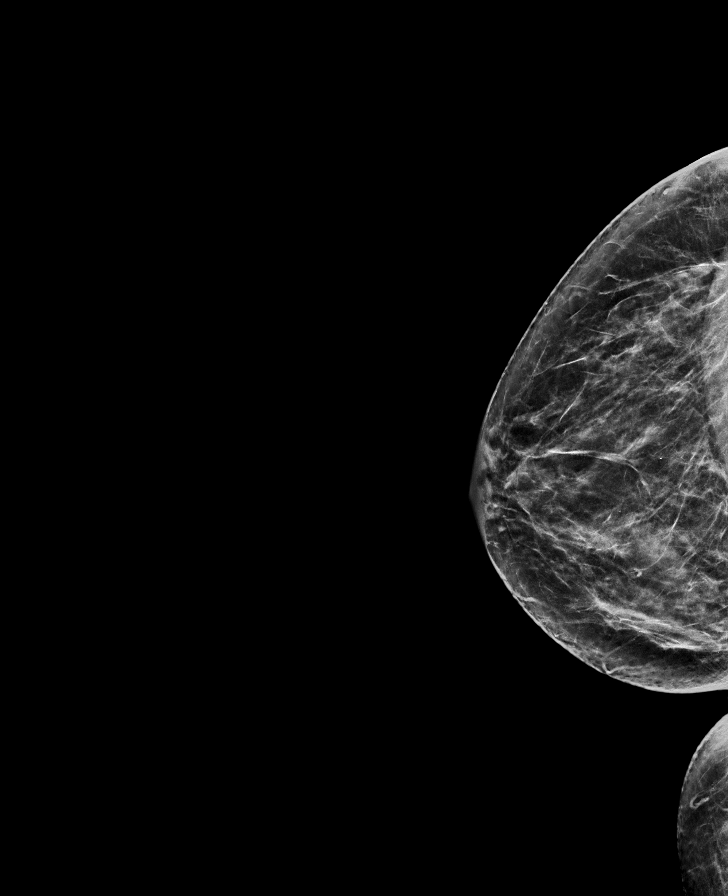

[L MLO tomo · 2 of 57 frames shown]
[frame 19/57]
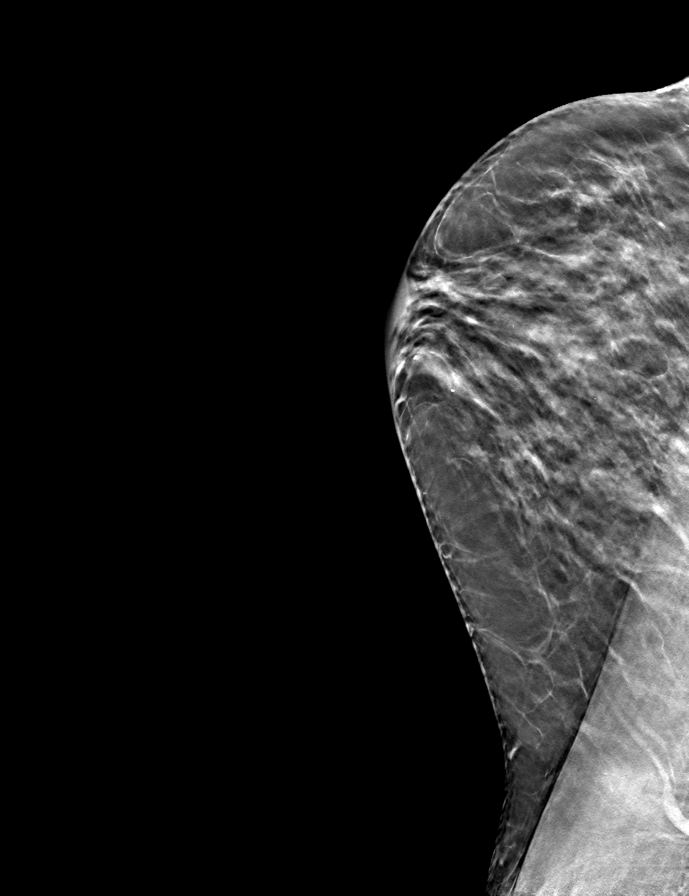
[frame 29/57]
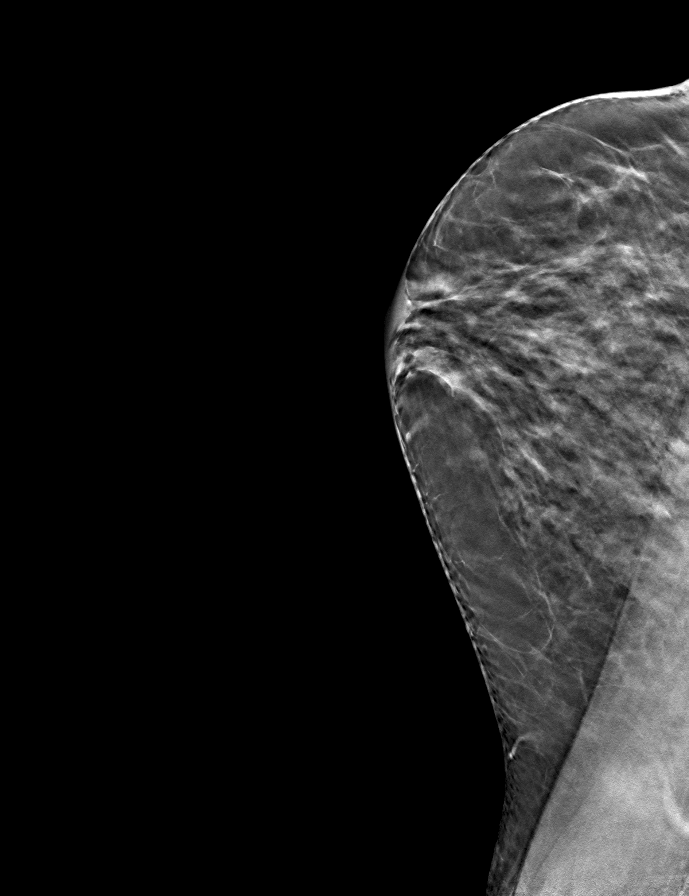

[R CC tomo · tomo slice 33/65.0]
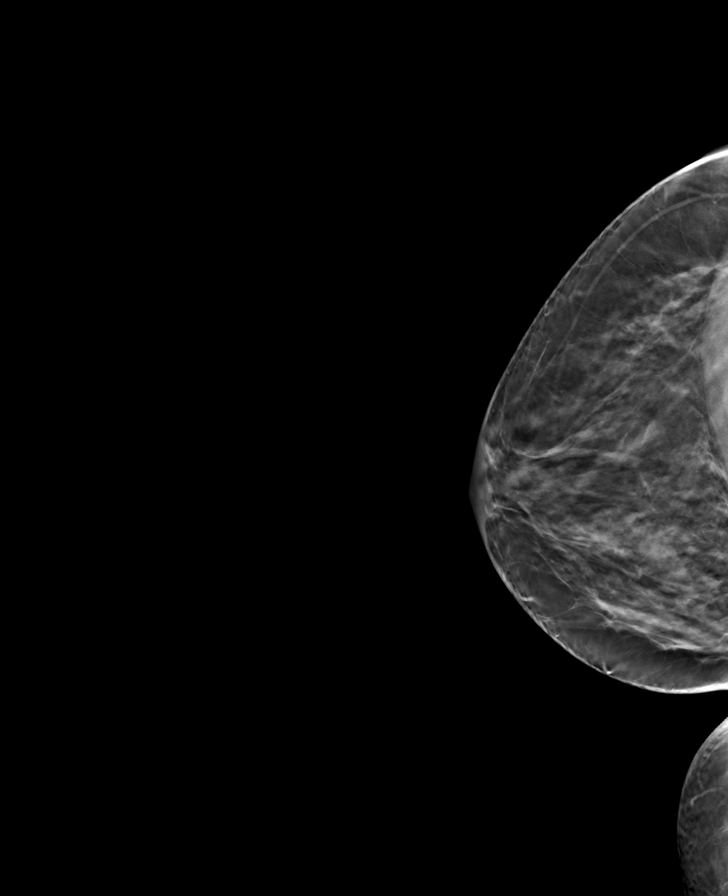

[R MLO tomo · tomo slice 27/53.0]
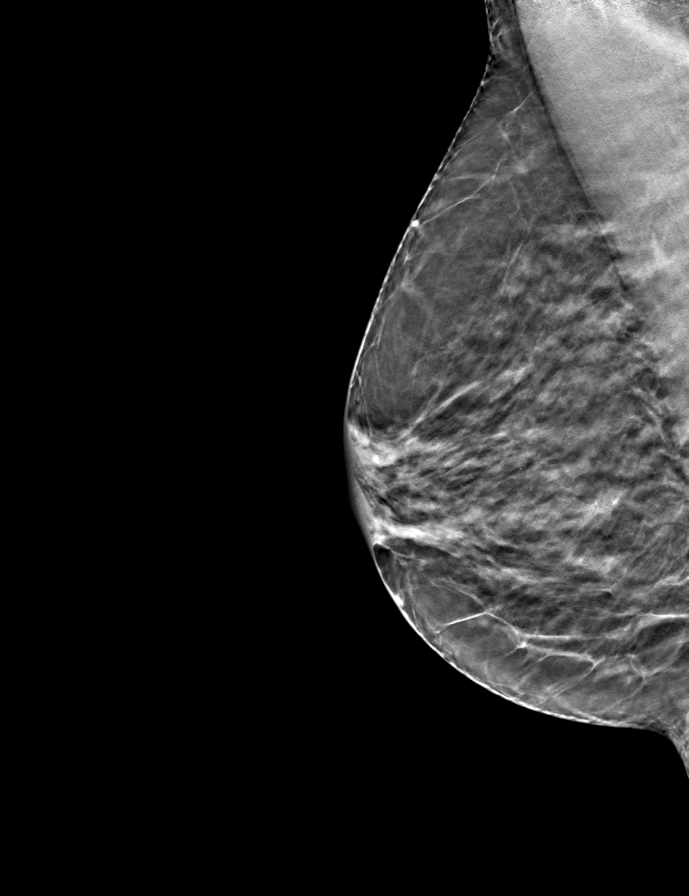

[L CC tomo · tomo slice 31/62.0]
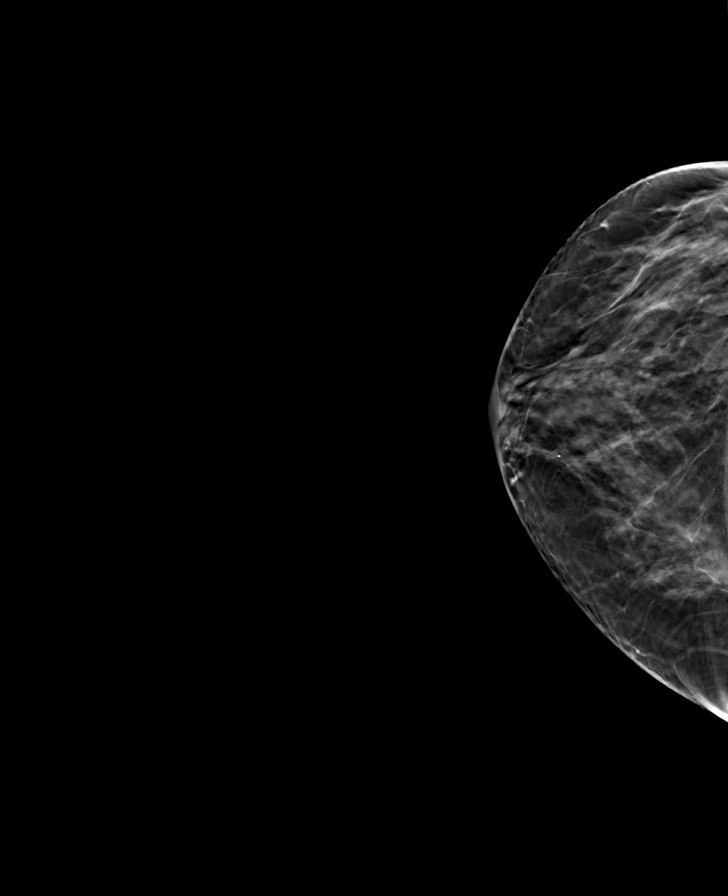

[9 of 24 positions shown; findings below may reference images not displayed]

ACR Breast Density Category c: The breast tissue is heterogeneously
dense, which may obscure small masses.
FINDINGS: There are no findings suspicious for malignancy. Images were
processed with CAD.
IMPRESSION: No mammographic evidence of malignancy. A result letter of this
screening mammogram will be mailed directly to the patient.

RECOMMENDATION:
Screening mammogram in one year. (Code:FT-U-LHB)

BI-RADS CATEGORY  1: Negative.
